# Patient Record
Sex: Male | Born: 2005 | Race: Black or African American | Hispanic: No | Marital: Single | Smoking: Never smoker
Health system: Southern US, Community
[De-identification: ages and names within clinical notes are randomized; demographics above are authoritative.]

## PROBLEM LIST (undated history)

## (undated) ENCOUNTER — Emergency Department (HOSPITAL_BASED_OUTPATIENT_CLINIC_OR_DEPARTMENT_OTHER): Admission: EM | Payer: Managed Care, Other (non HMO) | Source: Home / Self Care

## (undated) DIAGNOSIS — J45909 Unspecified asthma, uncomplicated: Secondary | ICD-10-CM

## (undated) DIAGNOSIS — R569 Unspecified convulsions: Secondary | ICD-10-CM

---
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
## 2023-02-17 ENCOUNTER — Emergency Department: Payer: Medicaid Other

## 2023-02-17 ENCOUNTER — Other Ambulatory Visit: Payer: Self-pay

## 2023-02-17 ENCOUNTER — Emergency Department
Admission: EM | Admit: 2023-02-17 | Discharge: 2023-02-17 | Disposition: A | Discharge status: Home / Self Care | Payer: Medicaid Other | Attending: Student in an Organized Health Care Education/Training Program | Admitting: Student in an Organized Health Care Education/Training Program

## 2023-02-17 DIAGNOSIS — R569 Unspecified convulsions: Secondary | ICD-10-CM | POA: Diagnosis present

## 2023-02-17 LAB — CBC WITH DIFFERENTIAL/PLATELET
Abs Immature Granulocytes: 0.01 10*3/uL (ref 0.00–0.07)
Basophils Absolute: 0 10*3/uL (ref 0.0–0.1)
Basophils Relative: 0 %
Eosinophils Absolute: 0.3 10*3/uL (ref 0.0–0.5)
Eosinophils Relative: 5 %
HCT: 43.5 % (ref 39.0–52.0)
Hemoglobin: 14.9 g/dL (ref 13.0–17.0)
Immature Granulocytes: 0 %
Lymphocytes Relative: 48 %
Lymphs Abs: 2.7 10*3/uL (ref 0.7–4.0)
MCH: 32 pg (ref 26.0–34.0)
MCHC: 34.3 g/dL (ref 30.0–36.0)
MCV: 93.5 fL (ref 80.0–100.0)
Monocytes Absolute: 0.4 10*3/uL (ref 0.1–1.0)
Monocytes Relative: 8 %
Neutro Abs: 2.1 10*3/uL (ref 1.7–7.7)
Neutrophils Relative %: 39 %
Platelets: 155 10*3/uL (ref 150–400)
RBC: 4.65 MIL/uL (ref 4.22–5.81)
RDW: 12.4 % (ref 11.5–15.5)
WBC: 5.5 10*3/uL (ref 4.0–10.5)
nRBC: 0 % (ref 0.0–0.2)

## 2023-02-17 LAB — BASIC METABOLIC PANEL
Anion gap: 15 (ref 5–15)
BUN: 17 mg/dL (ref 6–20)
CO2: 20 mmol/L — ABNORMAL LOW (ref 22–32)
Calcium: 9.2 mg/dL (ref 8.9–10.3)
Chloride: 104 mmol/L (ref 98–111)
Creatinine, Ser: 1.09 mg/dL (ref 0.61–1.24)
GFR, Estimated: >60 mL/min (ref >60)
Glucose, Bld: 137 mg/dL — ABNORMAL HIGH (ref 70–99)
Potassium: 3.4 mmol/L — ABNORMAL LOW (ref 3.5–5.1)
Sodium: 139 mmol/L (ref 135–145)

## 2023-02-17 LAB — VALPROIC ACID LEVEL: Valproic Acid Lvl: 20 ug/mL — ABNORMAL LOW (ref 50.0–100.0)

## 2023-02-17 MED ORDER — VALPROATE SODIUM 100 MG/ML IV SOLN
900.0000 mg | INTRAVENOUS | Status: DC
  Administered 2023-02-17: 900 mg via INTRAVENOUS
  Filled 2023-02-17: qty 9

## 2023-02-17 MED ORDER — SODIUM CHLORIDE 0.9 % IV BOLUS
1000.0000 mL | Freq: Once | INTRAVENOUS | Status: AC
  Administered 2023-02-17: 1000 mL via INTRAVENOUS
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
## 2023-02-17 NOTE — Discharge Instructions (Signed)
Do not drive or operate vehicle or machinery until evaluated and cleared by your neurologist.
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
## 2023-02-18 ENCOUNTER — Emergency Department (HOSPITAL_BASED_OUTPATIENT_CLINIC_OR_DEPARTMENT_OTHER)
Admission: EM | Admit: 2023-02-18 | Discharge: 2023-02-19 | Disposition: A | Discharge status: Home / Self Care | Payer: Medicaid Other | Source: Home / Self Care | Attending: Emergency Medicine | Admitting: Emergency Medicine

## 2023-02-18 ENCOUNTER — Other Ambulatory Visit: Payer: Self-pay

## 2023-02-18 DIAGNOSIS — R569 Unspecified convulsions: Secondary | ICD-10-CM | POA: Insufficient documentation

## 2023-02-18 DIAGNOSIS — J45909 Unspecified asthma, uncomplicated: Secondary | ICD-10-CM | POA: Insufficient documentation

## 2023-02-18 MED ORDER — DIAZEPAM 5 MG/ML IJ SOLN
5.0000 mg | Freq: Once | INTRAMUSCULAR | Status: AC
  Administered 2023-02-18: 5 mg via INTRAVENOUS
  Filled 2023-02-18: qty 2

## 2023-02-18 MED ORDER — VALPROATE SODIUM 100 MG/ML IV SOLN
2400.0000 mg | Freq: Once | INTRAVENOUS | Status: DC
  Filled 2023-02-18: qty 24

## 2023-02-18 MED ORDER — KETOROLAC TROMETHAMINE 15 MG/ML IJ SOLN
15.0000 mg | Freq: Once | INTRAMUSCULAR | Status: AC
  Administered 2023-02-18: 15 mg via INTRAVENOUS
  Filled 2023-02-18: qty 1

## 2023-02-18 MED ORDER — VALPROATE SODIUM 100 MG/ML IV SOLN: 2400.0000 mg | Freq: Once | INTRAVENOUS | Status: DC

## 2023-02-18 NOTE — ED Notes (Addendum)
Patient is resting comfortably. Family at bedside.  

## 2023-02-18 NOTE — ED Notes (Signed)
-  Called [facility] line for Pediatric Consult at 1013pm.

## 2023-02-18 NOTE — ED Notes (Signed)
Patient is resting comfortably. Family has no needs.
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
## 2023-02-18 NOTE — ED Triage Notes (Signed)
Pt POV from home with mom, per mom pt had seizure yesterday and another one this  morning, went to [facility] where his neurologist is and was discharged after being observed for a few hours. Takes seizure meds as prescribed. Now having neck spasms and eyes are rolling back intermittently

## 2023-02-19 MED ORDER — VALPROATE SODIUM 100 MG/ML IV SOLN
2400.0000 mg | Freq: Once | INTRAVENOUS | Status: AC
  Administered 2023-02-19: 2400 mg via INTRAVENOUS
  Filled 2023-02-19: qty 25

## 2023-02-19 MED ORDER — METHOCARBAMOL 500 MG PO TABS: 500.0000 mg | ORAL_TABLET | Freq: Three times a day (TID) | ORAL | 0 refills | Status: AC | PRN

## 2023-02-19 NOTE — ED Provider Notes (Signed)
I assumed care of this patient from previous provider.  Please see their note for further details of history, exam, and MDM.   Briefly patient is a 17 y.o. male who presented neck spasms. Possible seizure vs dystonic reaction. Given valium. Plan to load with Depakote given low level in last 48 hrs. Will reassess.  3:01 AM Now awake and ambulating well. No additional episodes.  The patient appears reasonably screened and/or stabilized for discharge and I doubt any other medical condition or other [facility] requiring further screening, evaluation, or treatment in the ED at this time. I have discussed the findings, Dx and Tx plan with the patient/family who expressed understanding and agree(s) with the plan. Discharge instructions discussed at length. The patient/family was given strict return precautions who verbalized understanding of the instructions. No further questions at time of discharge.  Disposition: Discharge  Condition: Good  ED Discharge Orders          Ordered    methocarbamol (ROBAXIN) 500 MG tablet  Every 8 hours PRN        02/19/23 0258              Follow Up: Silvano Rusk, MD [facility] [address], [address] [place] [address] [phone]  Call  as needed  Berniece Andreas, NP [facility] [address] [place] [address] [phone]  Call           Nira Conn, MD 02/19/23 [phone]

## 2023-02-19 NOTE — ED Notes (Signed)
Valproate infusion completed.

## 2023-02-19 NOTE — ED Notes (Signed)
Pt ambulated without assistance. No complaints of dizziness and/or unsteadiness.

## 2023-02-19 NOTE — ED Notes (Addendum)
Pt given water and ginger-ale per request. Provider aware
